# Patient Record
Sex: Female | Born: 2005 | Race: White | Hispanic: No | Marital: Single | Smoking: Never smoker
Health system: Southern US, Community
[De-identification: ages and names within clinical notes are randomized; demographics above are authoritative.]

## PROBLEM LIST (undated history)

## (undated) DIAGNOSIS — G43909 Migraine, unspecified, not intractable, without status migrainosus: Secondary | ICD-10-CM

## (undated) HISTORY — PX: NO PAST SURGERIES: SHX2092

## (undated) HISTORY — DX: Migraine, unspecified, not intractable, without status migrainosus: G43.909

---
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
## 2024-01-01 ENCOUNTER — Other Ambulatory Visit

## 2024-01-08 ENCOUNTER — Other Ambulatory Visit

## 2024-01-08 ENCOUNTER — Other Ambulatory Visit: Payer: Self-pay | Admitting: Obstetrics

## 2024-01-08 NOTE — Progress Notes (Signed)
 Patient 11.[redacted] weeks gestation with hyperemesis, with positive weight loss BMI 17 ordered banana bag weekly x 4  Bobbette Brunswick CNM

## 2024-01-09 ENCOUNTER — Encounter
Admission: RE | Admit: 2024-01-09 | Discharge: 2024-01-09 | Disposition: A | Discharge status: Home / Self Care | Source: Ambulatory Visit | Attending: Obstetrics | Admitting: Obstetrics

## 2024-01-09 DIAGNOSIS — Z3A11 11 weeks gestation of pregnancy: Secondary | ICD-10-CM | POA: Insufficient documentation

## 2024-01-09 DIAGNOSIS — Z681 Body mass index (BMI) 19 or less, adult: Secondary | ICD-10-CM | POA: Insufficient documentation

## 2024-01-09 DIAGNOSIS — O218 Other vomiting complicating pregnancy: Secondary | ICD-10-CM | POA: Insufficient documentation

## 2024-01-09 DIAGNOSIS — R634 Abnormal weight loss: Secondary | ICD-10-CM | POA: Insufficient documentation

## 2024-01-09 DIAGNOSIS — R1110 Vomiting, unspecified: Secondary | ICD-10-CM | POA: Diagnosis present

## 2024-01-09 MED ORDER — THIAMINE HCL 100 MG/ML IJ SOLN
Freq: Once | INTRAVENOUS | Status: AC
  Filled 2024-01-09: qty 1000

## 2024-01-10 MED FILL — Multiple Vitamin IV Soln: INTRAVENOUS | Qty: 10 | Status: AC

## 2024-01-10 MED FILL — Thiamine HCl Inj 100 MG/ML: INTRAMUSCULAR | Qty: 1 | Status: AC

## 2024-01-10 MED FILL — Dextrose 5% w/ Sodium Chloride 0.45%: INTRAVENOUS | Qty: 1000 | Status: AC

## 2024-01-10 MED FILL — Folic Acid Inj 5 MG/ML: INTRAMUSCULAR | Qty: 0.2 | Status: AC

## 2024-01-14 ENCOUNTER — Encounter
Admission: RE | Admit: 2024-01-14 | Discharge: 2024-01-14 | Disposition: A | Discharge status: Home / Self Care | Source: Ambulatory Visit | Attending: Obstetrics | Admitting: Obstetrics

## 2024-01-14 DIAGNOSIS — O218 Other vomiting complicating pregnancy: Secondary | ICD-10-CM | POA: Diagnosis not present

## 2024-01-14 MED ORDER — THIAMINE HCL 100 MG/ML IJ SOLN
Freq: Once | INTRAVENOUS | Status: AC
  Filled 2024-01-14: qty 1000

## 2024-01-15 ENCOUNTER — Encounter: Admitting: Certified Nurse Midwife

## 2024-01-23 ENCOUNTER — Encounter: Admission: RE | Admit: 2024-01-23 | Source: Ambulatory Visit

## 2024-01-30 ENCOUNTER — Encounter: Attending: Obstetrics

## 2024-01-30 DIAGNOSIS — R634 Abnormal weight loss: Secondary | ICD-10-CM | POA: Insufficient documentation

## 2024-01-30 DIAGNOSIS — O218 Other vomiting complicating pregnancy: Secondary | ICD-10-CM | POA: Insufficient documentation

## 2024-01-30 DIAGNOSIS — Z3A11 11 weeks gestation of pregnancy: Secondary | ICD-10-CM | POA: Insufficient documentation

## 2024-01-30 DIAGNOSIS — Z681 Body mass index (BMI) 19 or less, adult: Secondary | ICD-10-CM | POA: Insufficient documentation

## 2024-04-30 ENCOUNTER — Encounter: Payer: Self-pay | Admitting: Intensive Care

## 2024-04-30 ENCOUNTER — Emergency Department
Admission: EM | Admit: 2024-04-30 | Discharge: 2024-04-30 | Disposition: A | Discharge status: Home / Self Care | Attending: Emergency Medicine | Admitting: Emergency Medicine

## 2024-04-30 ENCOUNTER — Other Ambulatory Visit: Payer: Self-pay

## 2024-04-30 DIAGNOSIS — O99012 Anemia complicating pregnancy, second trimester: Secondary | ICD-10-CM | POA: Diagnosis not present

## 2024-04-30 DIAGNOSIS — R109 Unspecified abdominal pain: Secondary | ICD-10-CM | POA: Insufficient documentation

## 2024-04-30 DIAGNOSIS — Z3A26 26 weeks gestation of pregnancy: Secondary | ICD-10-CM | POA: Diagnosis not present

## 2024-04-30 DIAGNOSIS — R197 Diarrhea, unspecified: Secondary | ICD-10-CM | POA: Diagnosis not present

## 2024-04-30 DIAGNOSIS — D509 Iron deficiency anemia, unspecified: Secondary | ICD-10-CM | POA: Insufficient documentation

## 2024-04-30 DIAGNOSIS — O219 Vomiting of pregnancy, unspecified: Secondary | ICD-10-CM | POA: Insufficient documentation

## 2024-04-30 DIAGNOSIS — O26892 Other specified pregnancy related conditions, second trimester: Secondary | ICD-10-CM | POA: Diagnosis present

## 2024-04-30 DIAGNOSIS — E860 Dehydration: Secondary | ICD-10-CM | POA: Diagnosis not present

## 2024-04-30 DIAGNOSIS — O99282 Endocrine, nutritional and metabolic diseases complicating pregnancy, second trimester: Secondary | ICD-10-CM | POA: Diagnosis not present

## 2024-04-30 DIAGNOSIS — R112 Nausea with vomiting, unspecified: Secondary | ICD-10-CM

## 2024-04-30 LAB — URINALYSIS, W/ REFLEX TO CULTURE (INFECTION SUSPECTED)
Bilirubin Urine: NEGATIVE
Glucose, UA: NEGATIVE mg/dL
Hgb urine dipstick: NEGATIVE
Ketones, ur: 20 mg/dL — AB
Leukocytes,Ua: NEGATIVE
Nitrite: NEGATIVE
Protein, ur: 30 mg/dL — AB
Specific Gravity, Urine: 1.026 (ref 1.005–1.030)
pH: 7 (ref 5.0–8.0)

## 2024-04-30 LAB — COMPREHENSIVE METABOLIC PANEL WITH GFR
ALT: 15 U/L (ref 0–44)
AST: 21 U/L (ref 15–41)
Albumin: 3.7 g/dL (ref 3.5–5.0)
Alkaline Phosphatase: 97 U/L (ref 38–126)
Anion gap: 12 (ref 5–15)
BUN: 11 mg/dL (ref 6–20)
CO2: 20 mmol/L — ABNORMAL LOW (ref 22–32)
Calcium: 8.9 mg/dL (ref 8.9–10.3)
Chloride: 104 mmol/L (ref 98–111)
Creatinine, Ser: 0.53 mg/dL (ref 0.44–1.00)
GFR, Estimated: >60 mL/min
Glucose, Bld: 99 mg/dL (ref 70–99)
Potassium: 4 mmol/L (ref 3.5–5.1)
Sodium: 136 mmol/L (ref 135–145)
Total Bilirubin: 0.6 mg/dL (ref 0.0–1.2)
Total Protein: 6.7 g/dL (ref 6.5–8.1)

## 2024-04-30 LAB — CBC
HCT: 27.1 % — ABNORMAL LOW (ref 36.0–46.0)
Hemoglobin: 8.3 g/dL — ABNORMAL LOW (ref 12.0–15.0)
MCH: 23.9 pg — ABNORMAL LOW (ref 26.0–34.0)
MCHC: 30.6 g/dL (ref 30.0–36.0)
MCV: 78.1 fL — ABNORMAL LOW (ref 80.0–100.0)
Platelets: 254 K/uL (ref 150–400)
RBC: 3.47 MIL/uL — ABNORMAL LOW (ref 3.87–5.11)
RDW: 14.8 % (ref 11.5–15.5)
WBC: 7.9 K/uL (ref 4.0–10.5)
nRBC: 0 % (ref 0.0–0.2)

## 2024-04-30 LAB — POC URINE PREG, ED: Preg Test, Ur: POSITIVE — AB

## 2024-04-30 LAB — MAGNESIUM: Magnesium: 1.7 mg/dL (ref 1.7–2.4)

## 2024-04-30 MED ORDER — DIPHENHYDRAMINE HCL 50 MG/ML IJ SOLN
12.5000 mg | Freq: Once | INTRAMUSCULAR | Status: AC
  Administered 2024-04-30: 12.5 mg via INTRAVENOUS
  Filled 2024-04-30: qty 1

## 2024-04-30 MED ORDER — LACTATED RINGERS IV BOLUS
1000.0000 mL | Freq: Once | INTRAVENOUS | Status: AC
  Administered 2024-04-30: 1000 mL via INTRAVENOUS

## 2024-04-30 MED ORDER — METOCLOPRAMIDE HCL 5 MG/ML IJ SOLN
5.0000 mg | Freq: Once | INTRAMUSCULAR | Status: AC
  Administered 2024-04-30: 5 mg via INTRAVENOUS
  Filled 2024-04-30: qty 2

## 2024-04-30 MED ORDER — METOCLOPRAMIDE HCL 10 MG PO TABS: 10.0000 mg | ORAL_TABLET | Freq: Three times a day (TID) | ORAL | 0 refills | Status: AC | PRN

## 2024-04-30 NOTE — ED Notes (Signed)
Urine sample collected and sent off.

## 2024-04-30 NOTE — Discharge Instructions (Addendum)
 You are seen in the emergency department for nausea, vomiting and diarrhea.  You had a ultrasound done that showed good fetal heartbeat.  Your lab work appeared to be dehydrated.  You had significant anemia with a hemoglobin of 8.8.:  Follow-up with your obstetrician.  You were given a prescription for nausea medication with Reglan .  It is importantly stay hydrated and drink plenty of fluids.  Restart your iron supplements.  Return to the emergency department for any ongoing or worsening symptoms.

## 2024-04-30 NOTE — ED Notes (Signed)
 Pt appears to be less anxious at this time. Resting with mother at bedside

## 2024-04-30 NOTE — ED Provider Notes (Signed)
 "  [facility] Provider Note    Event Date/Time   First MD Initiated Contact with Patient 04/30/24 [phone]     (approximate)   History   Emesis During Pregnancy   HPI  Hannah Tyler is a 18 y.o. female G1, P0 at [redacted] weeks gestational age who presents to the emergency department with nausea, vomiting and diarrhea.  Patient states that she recent [place] tested positive for influenza.  States that she was otherwise feeling okay with a mild cough.  Over the past 2 days has been having nausea, vomiting some diarrhea and feels like she not keep anything down.  No significant abdominal cramping.  Good fetal movement.  No vaginal bleeding or leakage of fluids.  Some mild right sided abdominal pain which she states has been ongoing for her.  Does state that she has a known history of anemia.  She has not been taking her iron supplements since she got influenza.       Physical Exam   Triage Vital Signs: ED Triage Vitals  Encounter Vitals Group     BP 04/30/24 0836 103/67     Girls Systolic BP Percentile --      Girls Diastolic BP Percentile --      Boys Systolic BP Percentile --      Boys Diastolic BP Percentile --      Pulse Rate 04/30/24 0836 (!) 119     Resp 04/30/24 0836 18     Temp 04/30/24 0836 97.7 F (36.5 C)     Temp Source 04/30/24 0836 Oral     SpO2 04/30/24 0836 94 %     Weight --      Height 04/30/24 0825 5' 2 (1.575 m)     Head Circumference --      Peak Flow --      Pain Score 04/30/24 0825 0     Pain Loc --      Pain Education --      Exclude from Growth Chart --     Most recent vital signs: Vitals:   04/30/24 0930 04/30/24 1239  BP: 104/69 106/66  Pulse: (!) 102 (!) 110  Resp: 18 17  Temp:    SpO2: 100% 100%    Physical Exam Constitutional:      Appearance: She is well-developed.  HENT:     Head: Atraumatic.  Eyes:     Conjunctiva/sclera: Conjunctivae normal.  Cardiovascular:     Rate and Rhythm: Regular rhythm.  Pulmonary:      Effort: No respiratory distress.  Abdominal:     General: There is no distension.     Tenderness: There is abdominal tenderness (mild right sided abdominal tenderness to palpation.  Negative Murphy sign.  No right lower quadrant abdominal tenderness.).  Musculoskeletal:        General: Normal range of motion.     Cervical back: Normal range of motion.  Skin:    General: Skin is warm.  Neurological:     Mental Status: She is alert. Mental status is at baseline.     IMPRESSION / MDM / ASSESSMENT AND PLAN / ED COURSE  I reviewed the triage vital signs and the nursing notes.  Differential diagnosis including viral gastroenteritis, symptomatic cholelithiasis, anemia, dehydration, metabolic abnormality, urinary tract infection   EKG  I, Clotilda Punter, the attending physician, personally viewed and interpreted this ECG.   Rate: Normal  Rhythm: Normal sinus  Axis: Normal  Intervals: Normal  ST&T Change: None  No tachycardic or bradycardic dysrhythmias while on cardiac telemetry.   LABS (all labs ordered are listed, but only abnormal results are displayed) Labs interpreted as -    Labs Reviewed  CBC - Abnormal; Notable for the following components:      Result Value   RBC 3.47 (*)    Hemoglobin 8.3 (*)    HCT 27.1 (*)    MCV 78.1 (*)    MCH 23.9 (*)    All other components within normal limits  COMPREHENSIVE METABOLIC PANEL WITH GFR - Abnormal; Notable for the following components:   CO2 20 (*)    All other components within normal limits  URINALYSIS, W/ REFLEX TO CULTURE (INFECTION SUSPECTED) - Abnormal; Notable for the following components:   Color, Urine AMBER (*)    APPearance CLOUDY (*)    Ketones, ur 20 (*)    Protein, ur 30 (*)    Bacteria, UA FEW (*)    All other components within normal limits  POC URINE PREG, ED - Abnormal; Notable for the following components:   Preg Test, Ur POSITIVE (*)    All other components within normal limits  MAGNESIUM      MDM   Clinical Course as of 04/30/24 1244  Wed Apr 30, 2024  1029 Patient's lab work with no significant leukocytosis.  Does have a significant anemia with a hemoglobin of 8.3 that appears microcytic, this is down trended from her baseline of 10.9.  CO2 is mildly low at 20.  Creatinine appears to be at her baseline.  No electrolyte abnormality.  Dystonic reaction with Reglan , after shared decision making given IV Benadryl to treat her symptoms. [SM]    Clinical Course User Index [SM] Suzanne Kirsch, MD   Discussed restarting her iron supplements.  Patient able to tolerate p.o.  Bedside ultrasound with good fetal movement, heart rate of 160.  Right upper quadrant ultrasound with no signs of gallstones and no gallbladder dilation.  Unable to visualize her common bile duct.  On reevaluation patient had significant improvement of her symptoms, is feeling much better, able to urinate in the emergency department and keep down liquid.  No significant electrolyte abnormality.  CO2 is mildly low at 20 and does have ketones in her urine.  No signs of urinary tract infection.  Discussed close follow-up with her obstetrician, oral hydration, given a prescription for Reglan .  Discussed return for any ongoing or worsening symptoms.  No questions at time of discharge.  PROCEDURES:  Critical Care performed: No  Procedures  Patient's presentation is most consistent with acute presentation with potential threat to life or bodily function.   MEDICATIONS ORDERED IN ED: Medications  lactated ringers bolus 1,000 mL (0 mLs Intravenous Stopped 04/30/24 1231)  metoCLOPramide  (REGLAN ) injection 5 mg (5 mg Intravenous Given 04/30/24 0946)  diphenhydrAMINE (BENADRYL) injection 12.5 mg (12.5 mg Intravenous Given 04/30/24 1012)    FINAL CLINICAL IMPRESSION(S) / ED DIAGNOSES   Final diagnoses:  Iron deficiency anemia, unspecified iron deficiency anemia type  Nausea vomiting and diarrhea  Dehydration      Rx / DC Orders   ED Discharge Orders          Ordered    metoCLOPramide  (REGLAN ) 10 MG tablet  Every 8 hours PRN        04/30/24 1235             Note:  This document was prepared using Dragon voice recognition software and may include unintentional dictation errors.  Suzanne Kirsch, MD 04/30/24 1244  "

## 2024-04-30 NOTE — ED Triage Notes (Addendum)
 Patient is [redacted] weeks pregnant. Reports unable to keep anything PO down since yesterday.   Denies any discharge or baby issues. Reports mild cramping in abdomen yesterday but none today Seen at [facility]

## 2024-05-13 ENCOUNTER — Other Ambulatory Visit: Payer: Self-pay | Admitting: Certified Nurse Midwife

## 2024-05-13 DIAGNOSIS — D509 Iron deficiency anemia, unspecified: Secondary | ICD-10-CM

## 2024-05-13 NOTE — Progress Notes (Signed)
 19 year old G1P0 at [redacted]w[redacted]d with iron deficiency anemia, Hgb 8.0, tried supplementing with oral iron but no increase in Hgb. Iron infusions ordered Venofer 300mg  IV weekly x 3.  Hannah Tyler, CNM 05/13/2024 11:32 AM

## 2024-05-14 ENCOUNTER — Telehealth (HOSPITAL_COMMUNITY): Payer: Self-pay

## 2024-05-14 ENCOUNTER — Encounter: Payer: Self-pay | Admitting: Obstetrics and Gynecology

## 2024-05-14 ENCOUNTER — Other Ambulatory Visit: Payer: Self-pay | Admitting: Obstetrics and Gynecology

## 2024-05-14 ENCOUNTER — Other Ambulatory Visit (HOSPITAL_COMMUNITY): Payer: Self-pay | Admitting: Obstetrics and Gynecology

## 2024-05-14 DIAGNOSIS — O99019 Anemia complicating pregnancy, unspecified trimester: Secondary | ICD-10-CM | POA: Insufficient documentation

## 2024-05-14 DIAGNOSIS — D509 Iron deficiency anemia, unspecified: Secondary | ICD-10-CM | POA: Insufficient documentation

## 2024-05-14 NOTE — Telephone Encounter (Signed)
 Auth Submission: NO AUTH NEEDED Site of care: Site of care: [facility] Payer: [employer] Medication & CPT/J Code(s) submitted: Venofer (Iron Sucrose) J1756 Diagnosis Code:  Route of submission (phone, fax, portal):  Phone # Fax # Auth type: Buy/Bill HB Units/visits requested: 300mg  x 3 doses Reference number: [id number] Approval from: 05/14/24 to 08/12/24

## 2024-05-26 ENCOUNTER — Ambulatory Visit: Admission: RE | Admit: 2024-05-26 | Payer: PRIVATE HEALTH INSURANCE | Source: Ambulatory Visit

## 2024-05-29 ENCOUNTER — Ambulatory Visit
Admission: RE | Admit: 2024-05-29 | Discharge: 2024-05-29 | Disposition: A | Discharge status: Home / Self Care | Source: Ambulatory Visit | Attending: Certified Nurse Midwife | Admitting: Certified Nurse Midwife

## 2024-05-29 VITALS — BP 104/70 | HR 114 | Temp 97.9°F | Resp 16 | Ht 62.0 in | Wt 128.0 lb

## 2024-05-29 DIAGNOSIS — D509 Iron deficiency anemia, unspecified: Secondary | ICD-10-CM | POA: Diagnosis present

## 2024-05-29 DIAGNOSIS — O99013 Anemia complicating pregnancy, third trimester: Secondary | ICD-10-CM | POA: Diagnosis present

## 2024-05-29 MED ORDER — IRON SUCROSE 300 MG IVPB - SIMPLE MED
300.0000 mg | Freq: Once | Status: AC
  Administered 2024-05-29: 300 mg via INTRAVENOUS
  Filled 2024-05-29: qty 300

## 2024-06-01 ENCOUNTER — Encounter: Payer: Self-pay | Admitting: Obstetrics and Gynecology

## 2024-06-02 ENCOUNTER — Ambulatory Visit: Payer: PRIVATE HEALTH INSURANCE

## 2024-06-03 ENCOUNTER — Encounter: Payer: Self-pay | Admitting: Obstetrics and Gynecology

## 2024-06-05 ENCOUNTER — Inpatient Hospital Stay
Admission: RE | Admit: 2024-06-05 | Discharge: 2024-06-05 | Disposition: A | Payer: PRIVATE HEALTH INSURANCE | Source: Ambulatory Visit | Attending: Obstetrics and Gynecology | Admitting: Obstetrics and Gynecology

## 2024-06-05 VITALS — BP 104/62 | HR 98 | Temp 97.6°F | Resp 17 | Ht 62.0 in | Wt 128.0 lb

## 2024-06-05 DIAGNOSIS — D509 Iron deficiency anemia, unspecified: Secondary | ICD-10-CM

## 2024-06-05 DIAGNOSIS — O99013 Anemia complicating pregnancy, third trimester: Secondary | ICD-10-CM

## 2024-06-05 MED ORDER — IRON SUCROSE 300 MG IVPB - SIMPLE MED
300.0000 mg | Freq: Once | Status: AC
  Administered 2024-06-05: 300 mg via INTRAVENOUS
  Filled 2024-06-05: qty 300

## 2024-06-09 ENCOUNTER — Ambulatory Visit: Payer: PRIVATE HEALTH INSURANCE

## 2024-06-12 ENCOUNTER — Ambulatory Visit: Payer: PRIVATE HEALTH INSURANCE

## 2024-07-03 ENCOUNTER — Encounter: Admitting: Physician Assistant
